# Patient Record
Sex: Female | Born: 2005 | Race: White | Hispanic: No | Marital: Single | Smoking: Never smoker
Health system: Southern US, Community
[De-identification: ages and names within clinical notes are randomized; demographics above are authoritative.]

---
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
## 2024-01-25 ENCOUNTER — Telehealth: Payer: Self-pay

## 2024-01-25 MED ORDER — DOXYCYCLINE HYCLATE 100 MG PO TABS: 100.0000 mg | ORAL_TABLET | Freq: Every day | ORAL | 3 refills | Status: AC

## 2024-01-25 NOTE — Telephone Encounter (Signed)
 Patient's mother advised and follow up appt scheduled. aw

## 2024-01-25 NOTE — Telephone Encounter (Signed)
 Patient's mother called asking can we send in Doxycycline for patient. She states this was discussed at August appt of last year but patient did not want to start at that time.   Mother wants to know can we send in and schedule acne follow up?

## 2024-05-16 ENCOUNTER — Ambulatory Visit: Admitting: Dermatology

## 2024-07-19 ENCOUNTER — Other Ambulatory Visit: Payer: Self-pay

## 2024-07-19 MED ORDER — WINLEVI 1 % EX CREA: 1.0000 "application " | TOPICAL_CREAM | Freq: Two times a day (BID) | CUTANEOUS | 0 refills | Status: DC

## 2024-07-19 NOTE — Progress Notes (Signed)
 Patient is now out of town for college. She will be home for winter break. 1 RF of Winlevi  sent in and scheduled to return to office during break. aw

## 2024-07-22 ENCOUNTER — Other Ambulatory Visit: Payer: Self-pay | Admitting: Dermatology

## 2024-07-30 ENCOUNTER — Other Ambulatory Visit: Payer: Self-pay | Admitting: Dermatology

## 2024-08-01 ENCOUNTER — Other Ambulatory Visit: Payer: Self-pay | Admitting: Dermatology

## 2024-08-29 ENCOUNTER — Other Ambulatory Visit: Payer: Self-pay | Admitting: Dermatology

## 2024-11-21 ENCOUNTER — Ambulatory Visit

## 2024-11-21 DIAGNOSIS — L700 Acne vulgaris: Secondary | ICD-10-CM

## 2024-11-21 MED ORDER — TRETINOIN 0.025 % EX CREA: TOPICAL_CREAM | Freq: Every day | CUTANEOUS | 5 refills | Status: AC

## 2024-11-21 MED ORDER — AMZEEQ 4 % EX FOAM: CUTANEOUS | 2 refills | Status: AC

## 2024-11-21 MED ORDER — WINLEVI 1 % EX CREA: 1.0000 "application " | TOPICAL_CREAM | Freq: Two times a day (BID) | CUTANEOUS | 0 refills | Status: DC

## 2024-11-21 NOTE — Patient Instructions (Addendum)
 Sunscreen  Who needs sunscreen? Everyone. Sunscreen use can help prevent skin cancer by protecting you from the sun's harmful ultraviolet rays. Anyone can get skin cancer, regardless of age, gender or race. In fact, it is estimated that one in five Americans will develop skin cancer in their lifetime.  Sunscreen alone cannot fully protect you. In addition to wearing sunscreen, dermatologists recommend taking the following steps to protect your skin and find skin cancer early:  Seek shade when appropriate, remembering that the sun's rays are strongest between 10 a.m. and 2 p.m. If your shadow is shorter than you are, seek shade. Dress to protect yourself from the sun by wearing a lightweight long-sleeved shirt, pants, a wide-brimmed hat and sunglasses, when possible.  Use extra caution near water, snow and sand as they reflect the damaging rays of the sun, which can increase your chance of sunburn.  Get vitamin D safely through a healthy diet that may include vitamin supplements. Don't seek the sun. Avoid tanning beds. Ultraviolet light from the sun and tanning beds can cause skin cancer and wrinkling. If you want to look tan, you may wish to use a self-tanning product, but continue to use sunscreen with it.  When should I use sunscreen? Every day you go outside--even if you're just walking to and from your form of transportation. The sun emits harmful UV rays year-round. Even on cloudy days, up to 80 percent of the sun's harmful UV rays can penetrate your skin. Snow, sand and water increase the need for sunscreen because they reflect the sun's rays.  How much sunscreen should I use, and how often should I apply it? Most people only apply 25-50 percent of the recommended amount of sunscreen. Apply enough sunscreen to cover all exposed skin. Most adults need about 1 ounce -- or enough to fill a shot glass -- to fully cover their body.  Don't forget to apply to the tops of your feet, your neck, your ears  and the top of your head. Apply sunscreen to dry skin 15 minutes before going outdoors.  Skin cancer also can form on the lips. To protect your lips, apply a lip balm or lipstick that contains sunscreen with an SPF of 30 or higher.  When outdoors, reapply sunscreen approximately every two hours, or after swimming or sweating, according to the directions on the bottle.   Broad-spectrum sunscreens protect against both UVA and UVB rays. What is the difference between the rays? Sunlight consists of two types of harmful rays that reach the earth -- UVA rays and UVB rays. Overexposure to either can lead to skin cancer. In addition to causing skin cancer, here's what each of these rays do:  UVA rays (or aging rays) can prematurely age your skin, causing wrinkles and age spots, and can pass through window glass. UVB rays (or burning rays) are the primary cause of sunburn and are blocked by window glass  There is no safe way to tan. Every time you tan, you damage your skin. As this damage builds, you speed up the aging of your skin and increase your risk for all types of skin cancer.  What is the difference between chemical and physical sunscreens? Chemical sunscreens work like a sponge, absorbing the sun's rays. They contain one or more of the following active ingredients: oxybenzone, avobenzone, octisalate, octocrylene, homosalate and octinoxate. These formulations tend to be easier to rub into the skin without leaving a white residue.   Physical sunscreens work like a shield,  sitting sit on the surface of your skin and deflecting the sun's rays. They contain the active ingredients zinc oxide and/or titanium dioxide. Use this sunscreen if you have sensitive skin.   What type of sunscreen should I use? The best type of sunscreen is the one you will use again and again. Just make sure it offers broad-spectrum (UVA and UVB) protection, has an SPF of 30+, and is water-resistant. The kind of sunscreen you use is  a matter of personal choice, and may vary depending on the area of the body to be protected. Available sunscreen options include lotions, creams, gels, ointments, wax sticks and sprays.  Recommended physical sunscreens for face: - Neutrogena Sheer Zinc - Aveeno Positively Mineral Sensitive - CeraVe Hydrating Mineral (also has a tinted version) - La Roche-Posay Anthelios Mineral Face (comes as a cream, lotion, light fluid, and there is also a tinted version).  - EltaMD UV Clear (also has a tinted version)  Recommended physical sunscreens for body: - Neutrogena Sheer Zinc Dry-Touch Sunscreen Sensitive Skin Lotion Broad Spectrum SPF 50 - Aveeno Positively Mineral Sensitive Skin Sunscreen Broad Spectrum SPF 50 - La Roche-Posay Anthelios SPF 50 Mineral Sunscreen - Gentle Lotion - CeraVe Hydrating Mineral Sunscreen SPF 50  Recommended chemical sunscreens for face: - Anthelios UV Correct Face Sunscreen SPF 70 with Niacinamide - Neutrogena Clear Face Oil-Free SPF 50 with Helioplex - Neutrogena Sport Face Oil-Free SPF 70+ with Helioplex - Aveeno Protect + Hydrate Sunscreen For Face SPF 70 - La Roche-Posay Anthelios Light Fluid Sunscreen for Face SPF 60  Recommended chemical sunscreens for body: - Neutrogena Ultra Sheer Dry-Touch Sunscreen SPF 70 - Aveeno Protect + Hydrate Broad Spectrum All-Day Hydration SPF 60 (comes in a big pump) - La Roche-Posay Anthelios Melt-In Milk Sunscreen SPF 60   Due to recent changes in healthcare laws, you may see results of your pathology and/or laboratory studies on MyChart before the doctors have had a chance to review them. We understand that in some cases there may be results that are confusing or concerning to you. Please understand that not all results are received at the same time and often the doctors may need to interpret multiple results in order to provide you with the best plan of care or course of treatment. Therefore, we ask that you please give us  2  business days to thoroughly review all your results before contacting the office for clarification. Should we see a critical lab result, you will be contacted sooner.   If You Need Anything After Your Visit  If you have any questions or concerns for your doctor, please call our main line at [phone] and press option 4 to reach your doctor's medical assistant. If no one answers, please leave a voicemail as directed and we will return your call as soon as possible. Messages left after 4 pm will be answered the following business day.   You may also send us  a message via MyChart. We typically respond to MyChart messages within 1-2 business days.  For prescription refills, please ask your pharmacy to contact our office. Our fax number is [phone].  If you have an urgent issue when the clinic is closed that cannot wait until the next business day, you can page your doctor at the number below.    Please note that while we do our best to be available for urgent issues outside of office hours, we are not available 24/7.   If you have an urgent issue and are unable  to reach us , you may choose to seek medical care at your doctor's office, retail clinic, urgent care center, or emergency room.  If you have a medical emergency, please immediately call 911 or go to the emergency department.  Pager Numbers  - Dr. Hester: [phone]  - Dr. Jackquline: [phone]  - Dr. Claudene: [phone]   - Dr. Raymund: [phone]  In the event of inclement weather, please call our main line at [phone] for an update on the status of any delays or closures.  Dermatology Medication Tips: Please keep the boxes that topical medications come in in order to help keep track of the instructions about where and how to use these. Pharmacies typically print the medication instructions only on the boxes and not directly on the medication tubes.   If your medication is too expensive, please contact our office  at [phone] option 4 or send us  a message through MyChart.   We are unable to tell what your co-pay for medications will be in advance as this is different depending on your insurance coverage. However, we may be able to find a substitute medication at lower cost or fill out paperwork to get insurance to cover a needed medication.   If a prior authorization is required to get your medication covered by your insurance company, please allow us  1-2 business days to complete this process.  Drug prices often vary depending on where the prescription is filled and some pharmacies may offer cheaper prices.  The website www.goodrx.com contains coupons for medications through different pharmacies. The prices here do not account for what the cost may be with help from insurance (it may be cheaper with your insurance), but the website can give you the price if you did not use any insurance.  - You can print the associated coupon and take it with your prescription to the pharmacy.  - You may also stop by our office during regular business hours and pick up a GoodRx coupon card.  - If you need your prescription sent electronically to a different pharmacy, notify our office through [facility] or by phone at [phone] option 4.     Si Usted Necesita Algo Despus de Su Visita  Tambin puede enviarnos un mensaje a travs de Clinical cytogeneticist. Por lo general respondemos a los mensajes de MyChart en el transcurso de 1 a 2 das hbiles.  Para renovar recetas, por favor pida a su farmacia que se ponga en contacto con nuestra oficina. Randi lakes de fax es [place] [phone].  Si tiene un asunto urgente cuando la clnica est cerrada y que no puede esperar hasta el siguiente da hbil, puede llamar/localizar a su doctor(a) al nmero que aparece a continuacin.   Por favor, tenga en cuenta que aunque hacemos todo lo posible para estar disponibles para asuntos urgentes fuera del horario de [place], no estamos  disponibles las 24 horas del da, los 7 [address]  [address] de la [place].   Si tiene un problema urgente y no puede comunicarse con nosotros, puede optar por buscar atencin mdica  en el consultorio de su doctor(a), en una clnica privada, en un centro de atencin urgente o en una sala de emergencias.  Si tiene Engineer, drilling, por favor llame inmediatamente al 911 o vaya a la sala de emergencias.  Nmeros de bper  - Dr. Hester: [phone]  - Dra. Jackquline: [phone]  - Dr. Claudene: [phone]  - Dra. Kitts: [phone]  En caso de inclemencias del tiempo, por favor llame a nuestra  lnea principal al [phone] para una actualizacin sobre el [place] de cualquier retraso o cierre.  Consejos para la medicacin en dermatologa: Por favor, guarde las cajas en las que vienen los medicamentos de uso tpico para ayudarle a seguir las instrucciones sobre dnde y cmo usarlos. Las farmacias generalmente imprimen las instrucciones del medicamento slo en las cajas y no directamente en los tubos del [place].   Si su medicamento es muy caro, por favor, pngase en contacto con landry rieger llamando al [phone] y presione la opcin 4 o envenos un mensaje a travs de Clinical cytogeneticist.   No podemos decirle cul ser su copago por los medicamentos por adelantado ya que esto es diferente dependiendo de la cobertura de su seguro. Sin embargo, es posible que podamos encontrar un medicamento sustituto a Audiological scientist un formulario para que el seguro cubra el medicamento que se considera necesario.   Si se requiere una autorizacin previa para que su compaa de seguros malta su medicamento, por favor permtanos de 1 a 2 das hbiles para completar este proceso.  Los precios de los medicamentos varan con frecuencia dependiendo del Environmental consultant de dnde se surte la receta y alguna farmacias pueden ofrecer precios ms baratos.  El sitio web www.goodrx.com tiene cupones para medicamentos de Engineer, civil (consulting). Los precios aqu no tienen en cuenta lo que podra costar con la ayuda del seguro (puede ser ms barato con su seguro), pero el sitio web puede darle el precio si no utiliz Tourist information centre manager.  - Puede imprimir el cupn correspondiente y llevarlo con su receta a la farmacia.  - Tambin puede pasar por nuestra oficina durante el horario de atencin regular y Education officer, museum una tarjeta de cupones de GoodRx.  - Si necesita que su receta se enve electrnicamente a una farmacia diferente, informe a nuestra oficina a travs de MyChart de [place] o por telfono llamando al [phone] y presione la opcin 4.

## 2024-11-21 NOTE — Progress Notes (Signed)
" °  °  Subjective   Audrey Valdez is a 18 y.o. female who presents for the following: Acne. Patient is established patient   Today patient reports: Acne on the face; patient is currently using Winlevi  Adapalene  and Amzeeq . She feels these are working well and needs refills.   Review of Systems:    No other skin or systemic complaints except as noted in HPI or Assessment and Plan.  The following portions of the chart were reviewed this encounter and updated as appropriate: medications, allergies, medical history  Relevant Medical History:  n/a   Objective  (SKPE) Well appearing patient in no apparent distress; mood and affect are within normal limits. Examination was performed of the: Focused Exam of: Face   Examination notable for: Acne vulgaris: Scattered open and closed comedones on the face. Red, inflammatory papules and pustules on face       Assessment & Plan  (SKAP)   Acne vulgaris - mild - moderate, inflammatory and comedonal  - Chronic and persistent condition with duration or expected duration over one year. Condition is symptomatic and bothersome to patient. Patient is flaring and not currently at treatment goal.  - Discussed various treatment options with patient, as well as need for consistent use for at least 6-12 weeks for full efficacy.  - Reviewed treatment options, including side effects of topical agents, oral antibiotics, OCPs (if female), oral spironolactone  (if female), and isotretinoin . Discussed that isotretinoin  is the most effective  - Continue Winlevi  1% cream apply topically to the face twice faily - Continue Amzeeq  4% foam apply topically to the face nightly  - Stop adapalene  and Start tretinoin  0.025% cream in the evening. Educated patient about proper use and potential side effects, including dryness, irritation, sun sensitivity, and transient worsening of acne. - Discussed could consider oral tx if not well controlled, defers for now     Was sun  protection counseling provided?: Yes   Level of service outlined above   Patient instructions (SKPI)   Procedures, orders, diagnosis for this visit:    There are no diagnoses linked to this encounter.  Return to clinic: Return in about 3 months (around 02/19/2025) for Acne.  I, Emerick Ege, CMA am acting as scribe for Lauraine JAYSON Kanaris, MD.   Documentation: I have reviewed the above documentation for accuracy and completeness, and I agree with the above.  Lauraine JAYSON Kanaris, MD  "

## 2024-11-29 ENCOUNTER — Other Ambulatory Visit: Payer: Self-pay

## 2024-12-28 ENCOUNTER — Other Ambulatory Visit: Payer: Self-pay
